# Patient Record
Sex: Female | Born: 1995 | Race: Black or African American | Hispanic: No | Marital: Single | Smoking: Never smoker
Health system: Southern US, Community
[De-identification: ages and names within clinical notes are randomized; demographics above are authoritative.]

## PROBLEM LIST (undated history)

## (undated) DIAGNOSIS — B20 Human immunodeficiency virus [HIV] disease: Secondary | ICD-10-CM

## (undated) DIAGNOSIS — Z21 Asymptomatic human immunodeficiency virus [HIV] infection status: Secondary | ICD-10-CM

## (undated) HISTORY — DX: Human immunodeficiency virus (HIV) disease: B20

## (undated) HISTORY — DX: Asymptomatic human immunodeficiency virus (hiv) infection status: Z21

## (undated) HISTORY — PX: OTHER SURGICAL HISTORY: SHX169

---

## 2021-02-26 ENCOUNTER — Ambulatory Visit: Payer: Self-pay

## 2021-02-26 ENCOUNTER — Telehealth: Payer: Self-pay

## 2021-02-26 VITALS — Ht 62.21 in

## 2021-02-26 DIAGNOSIS — Z21 Asymptomatic human immunodeficiency virus [HIV] infection status: Secondary | ICD-10-CM | POA: Insufficient documentation

## 2021-02-26 NOTE — Telephone Encounter (Signed)
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  Need to: Complete Epi Send for Chest x-ray

## 2021-02-26 NOTE — Telephone Encounter (Signed)
Phone call to pt with [employer] interpreter [phone].  Epi completed. Chest x-ray ordered.  See completed TB document dated 02/26/21.

## 2021-02-26 NOTE — Addendum Note (Signed)
Addended by: Tracey Harries on: 02/26/2021 02:35 PM   Modules accepted: Orders, Level of Service

## 2021-02-26 NOTE — Progress Notes (Signed)
The information found in this document was obtained and reviewed during phone interview with pt.  Pt had negative T-Spot. Epi and chest x-ray needed due to HIV positive and coming from country with high incidence of TB.  Pt states she had not been taking her HIV medication for about 1 month; she was supplied some more about two days ago and restarted it.  Does not know her current wt.  Pt will contact case worker to get transportation to [facility] for chest x-ray. Chest x-ray ordered.

## 2021-03-03 ENCOUNTER — Other Ambulatory Visit: Payer: Self-pay

## 2021-03-03 ENCOUNTER — Ambulatory Visit
Admission: RE | Admit: 2021-03-03 | Discharge: 2021-03-03 | Disposition: A | Discharge status: Home / Self Care | Payer: Self-pay | Attending: Family Medicine | Admitting: Family Medicine

## 2021-03-03 ENCOUNTER — Emergency Department
Admission: EM | Admit: 2021-03-03 | Discharge: 2021-03-03 | Disposition: A | Discharge status: Home / Self Care | Payer: Self-pay | Attending: Emergency Medicine | Admitting: Emergency Medicine

## 2021-03-03 ENCOUNTER — Ambulatory Visit
Admission: RE | Admit: 2021-03-03 | Discharge: 2021-03-03 | Disposition: A | Discharge status: Home / Self Care | Payer: Self-pay | Source: Ambulatory Visit | Attending: Family Medicine | Admitting: Family Medicine

## 2021-03-03 ENCOUNTER — Encounter: Payer: Self-pay | Admitting: Emergency Medicine

## 2021-03-03 DIAGNOSIS — Z21 Asymptomatic human immunodeficiency virus [HIV] infection status: Secondary | ICD-10-CM | POA: Insufficient documentation

## 2021-03-03 DIAGNOSIS — M549 Dorsalgia, unspecified: Secondary | ICD-10-CM | POA: Diagnosis not present

## 2021-03-03 DIAGNOSIS — R310 Gross hematuria: Secondary | ICD-10-CM | POA: Diagnosis not present

## 2021-03-03 DIAGNOSIS — R319 Hematuria, unspecified: Secondary | ICD-10-CM | POA: Diagnosis present

## 2021-03-03 DIAGNOSIS — R519 Headache, unspecified: Secondary | ICD-10-CM | POA: Diagnosis not present

## 2021-03-03 LAB — CBC WITH DIFFERENTIAL/PLATELET
Abs Immature Granulocytes: 0.01 10*3/uL (ref 0.00–0.07)
Basophils Absolute: 0 10*3/uL (ref 0.0–0.1)
Basophils Relative: 0 %
Eosinophils Absolute: 0.1 10*3/uL (ref 0.0–0.5)
Eosinophils Relative: 1 %
HCT: 41.3 % (ref 36.0–46.0)
Hemoglobin: 13.6 g/dL (ref 12.0–15.0)
Immature Granulocytes: 0 %
Lymphocytes Relative: 53 %
Lymphs Abs: 3.1 10*3/uL (ref 0.7–4.0)
MCH: 27.6 pg (ref 26.0–34.0)
MCHC: 32.9 g/dL (ref 30.0–36.0)
MCV: 83.9 fL (ref 80.0–100.0)
Monocytes Absolute: 0.4 10*3/uL (ref 0.1–1.0)
Monocytes Relative: 7 %
Neutro Abs: 2.3 10*3/uL (ref 1.7–7.7)
Neutrophils Relative %: 39 %
Platelets: 226 10*3/uL (ref 150–400)
RBC: 4.92 MIL/uL (ref 3.87–5.11)
RDW: 13.9 % (ref 11.5–15.5)
WBC: 5.8 10*3/uL (ref 4.0–10.5)
nRBC: 0 % (ref 0.0–0.2)

## 2021-03-03 LAB — URINALYSIS, ROUTINE W REFLEX MICROSCOPIC
Bacteria, UA: NONE SEEN
Bilirubin Urine: NEGATIVE
Glucose, UA: NEGATIVE mg/dL
Ketones, ur: NEGATIVE mg/dL
Leukocytes,Ua: NEGATIVE
Nitrite: NEGATIVE
Protein, ur: NEGATIVE mg/dL
Specific Gravity, Urine: 1.011 (ref 1.005–1.030)
pH: 5 (ref 5.0–8.0)

## 2021-03-03 LAB — COMPREHENSIVE METABOLIC PANEL
ALT: 16 U/L (ref 0–44)
AST: 18 U/L (ref 15–41)
Albumin: 4.2 g/dL (ref 3.5–5.0)
Alkaline Phosphatase: 51 U/L (ref 38–126)
Anion gap: 7 (ref 5–15)
BUN: 12 mg/dL (ref 6–20)
CO2: 25 mmol/L (ref 22–32)
Calcium: 9.6 mg/dL (ref 8.9–10.3)
Chloride: 106 mmol/L (ref 98–111)
Creatinine, Ser: 0.59 mg/dL (ref 0.44–1.00)
GFR, Estimated: >60 mL/min (ref >60)
Glucose, Bld: 96 mg/dL (ref 70–99)
Potassium: 5 mmol/L (ref 3.5–5.1)
Sodium: 138 mmol/L (ref 135–145)
Total Bilirubin: 1 mg/dL (ref 0.3–1.2)
Total Protein: 7.7 g/dL (ref 6.5–8.1)

## 2021-03-03 LAB — CK: Total CK: 81 U/L (ref 38–234)

## 2021-03-03 MED ORDER — ACETAMINOPHEN 325 MG PO TABS
650.0000 mg | ORAL_TABLET | Freq: Once | ORAL | Status: AC
  Administered 2021-03-03: 650 mg via ORAL
  Filled 2021-03-03: qty 2

## 2021-03-03 NOTE — ED Provider Notes (Addendum)
[facility] Emergency Department Provider Note   ____________________________________________   Event Date/Time   First MD Initiated Contact with Patient 03/03/21 1815     (approximate)  I have reviewed the triage vital signs and the nursing notes.   HISTORY  Chief Complaint Hematuria    HPI Alicia Gonzalez is a 25 y.o. female history obtained through interpreter.  Patient reports she recently came here from [place].  In [place] she has been having problems with headache and back pain very frequently and takes Tylenol for it.  She was doing well however on her HIV medicine there.  Here she came and did not have any medicine for couple days and then was put on Biktarvy and then has developed hematuria for the several days.  Then she went on her menstrual period and now is bleeding from her menstrual period as well.  She shows me a picture of urine mixed with blood that is in the toilet that happened apparently before her menstrual period.  Patient is not having any fevers or different pain than she usually has.  The only new thing is the medicine and the hematuria.         Past Medical History:  Diagnosis Date   HIV (human immunodeficiency virus infection) [facility])     Patient Active Problem List   Diagnosis Date Noted   HIV positive (HCC) 02/26/2021    Past Surgical History:  Procedure Laterality Date   denies      Prior to Admission medications   Medication Sig Start Date End Date Taking? Authorizing Provider  bictegravir-emtricitabine-tenofovir AF (BIKTARVY) 50-200-25 MG TABS tablet Take 1 tablet by mouth daily. 02/09/21  Yes [provider]    Allergies Patient has no known allergies.  History reviewed. No pertinent family history.  Social History Social History   Tobacco Use   Smoking status: Never   Smokeless tobacco: Never  Vaping Use   Vaping Use: Never used  Substance Use Topics   Alcohol use: Not Currently    Comment:  last drink: about 2 years ago in January   Drug use: Never    Review of Systems  Constitutional: No fever/chills Eyes: No visual changes. ENT: No sore throat. Cardiovascular: Denies chest pain. Respiratory: Denies shortness of breath. Gastrointestinal: No abdominal pain.  No nausea, no vomiting.  No diarrhea.  No constipation. Genitourinary: Negative for dysuria. Musculoskeletal: Negative for new or different back pain. Skin: Negative for rash. Neurological: Negative for new or different headaches, any focal weakness   ____________________________________________   PHYSICAL EXAM:  VITAL SIGNS: ED Triage Vitals  Enc Vitals Group     BP 03/03/21 1708 133/88     Pulse Rate 03/03/21 1708 60     Resp 03/03/21 1708 18     Temp 03/03/21 1708 98.5 F (36.9 C)     Temp Source 03/03/21 1708 Oral     SpO2 03/03/21 1708 100 %     Weight 03/03/21 1709 110 lb 3.7 oz (50 kg)     Height 03/03/21 1709 5\' 2"  (1.575 m)     Head Circumference --      Peak Flow --      Pain Score 03/03/21 1716 0     Pain Loc --      Pain Edu? --      Excl. in GC? --     Constitutional: Alert and oriented. Well appearing and in no acute distress. Eyes: Conjunctivae are normal. Head: Atraumatic. Nose: No  congestion/rhinnorhea. Mouth/Throat: Mucous membranes are moist.   Neck: No stridor.  Cardiovascular: Normal rate, regular rhythm. Grossly normal heart sounds.  Good peripheral circulation. Respiratory: Normal respiratory effort.  No retractions. Lungs CTAB. Gastrointestinal: Soft and nontender. No distention. No abdominal bruits. No CVA tenderness. Musculoskeletal: No lower extremity tenderness nor edema.   Neurologic:  Normal speech and language. No gross focal neurologic deficits are appreciated.  Skin:  Skin is warm, dry and intact. No rash noted.   ____________________________________________   LABS (all labs ordered are listed, but only abnormal results are displayed)  Labs Reviewed   URINALYSIS, ROUTINE W REFLEX MICROSCOPIC - Abnormal; Notable for the following components:      Result Value   Color, Urine YELLOW (*)    APPearance CLEAR (*)    Hgb urine dipstick LARGE (*)    All other components within normal limits  CBC WITH DIFFERENTIAL/PLATELET  COMPREHENSIVE METABOLIC PANEL  CK  POC URINE PREG, ED   ____________________________________________  EKG   ____________________________________________  RADIOLOGY Gertha Calkin, personally viewed and evaluated these images (plain radiographs) as part of my medical decision making, as well as reviewing the written report by the radiologist.  ED MD interpretation: Chest x-ray done and read by radiology reviewed by me shows no acute disease this was apparently done for screening for TB.  Official radiology report(s): DG Chest 1 View  Result Date: 03/03/2021 CLINICAL DATA:  HIV positive. EXAM: CHEST  1 VIEW COMPARISON:  None. FINDINGS: The heart size and mediastinal contours are within normal limits. Both lungs are clear. The visualized skeletal structures are unremarkable. IMPRESSION: No active disease. Electronically Signed   By: Titus Dubin M.D.   On: 03/03/2021 15:57    ____________________________________________   PROCEDURES  Procedure(s) performed (including Critical Care):  Procedures   ____________________________________________   INITIAL IMPRESSION / ASSESSMENT AND PLAN / ED COURSE  Patient shows me a picture of the urine with blood in it in the toilet.  From what I understand this is from before her menstrual period.  Currently there is no blood in her urine.  Her renal function is normal and CBC is normal.  I will have her follow-up with primary care.  She will return if worse.  I discussed her case with [facility].  My questions were relayed to the ID doctor on-call who felt there was no cause for alarm at her history of hematuria even with the medication for HIV that she is on which can cause  renal problems.  I will have her return if she is worse and follow-up with primary care for further evaluation of her apparent hematuria.  Currently she is on her menses.  Even being on her menses there was no blood in the UA.  Again there were no red cells at all and the UA.          ____________________________________________   FINAL CLINICAL IMPRESSION(S) / ED DIAGNOSES  Final diagnoses:  Gross hematuria   Actual diagnosis is history of gross hematuria  ED Discharge Orders     None        Note:  This document was prepared using Dragon voice recognition software and may include unintentional dictation errors.    Nena Polio, MD 03/03/21 2047    Nena Polio, MD 03/03/21 2116

## 2021-03-03 NOTE — ED Triage Notes (Signed)
Pt comes into the ED via POV c/o hematuria.  Pt states that this started after starting a new medicine.  Pt explains that she was sent for a scan, given meds, and after that the blood in her urine started.  RN clarified between vaginal bleeding vs urine.  Pt states that she isnt sure, but that she is only seeing the blood in her urine currently.  Pt states that the scan she had was of her chest to r/o any TB, pt states that she has not received these results back yet.

## 2021-03-03 NOTE — Discharge Instructions (Signed)
I discussed your case with infectious disease at [facility].  They feel that she should continue taking your medicine and follow-up with them on your previously scheduled appointment on the 15th of this month.  Please return here for any abdominal pain, fever or vomiting or if you feel sicker.  Also return if you have back pain is unlike your usual back pain.  Please follow-up with your primary care doctor as well.  Currently there is no blood in your urine and your kidney function is normal.

## 2021-04-19 ENCOUNTER — Emergency Department
Admission: EM | Admit: 2021-04-19 | Discharge: 2021-04-19 | Disposition: A | Discharge status: Home / Self Care | Payer: Self-pay | Attending: Emergency Medicine | Admitting: Emergency Medicine

## 2021-04-19 ENCOUNTER — Other Ambulatory Visit: Payer: Self-pay

## 2021-04-19 ENCOUNTER — Encounter: Payer: Self-pay | Admitting: Radiology

## 2021-04-19 ENCOUNTER — Emergency Department: Payer: Self-pay

## 2021-04-19 DIAGNOSIS — R109 Unspecified abdominal pain: Secondary | ICD-10-CM | POA: Diagnosis not present

## 2021-04-19 DIAGNOSIS — E860 Dehydration: Secondary | ICD-10-CM | POA: Diagnosis not present

## 2021-04-19 DIAGNOSIS — R112 Nausea with vomiting, unspecified: Secondary | ICD-10-CM | POA: Insufficient documentation

## 2021-04-19 DIAGNOSIS — Z20822 Contact with and (suspected) exposure to covid-19: Secondary | ICD-10-CM | POA: Diagnosis not present

## 2021-04-19 DIAGNOSIS — Z21 Asymptomatic human immunodeficiency virus [HIV] infection status: Secondary | ICD-10-CM | POA: Diagnosis not present

## 2021-04-19 DIAGNOSIS — Z79899 Other long term (current) drug therapy: Secondary | ICD-10-CM | POA: Diagnosis not present

## 2021-04-19 LAB — CBC
HCT: 42.5 % (ref 36.0–46.0)
Hemoglobin: 14.3 g/dL (ref 12.0–15.0)
MCH: 27.7 pg (ref 26.0–34.0)
MCHC: 33.6 g/dL (ref 30.0–36.0)
MCV: 82.2 fL (ref 80.0–100.0)
Platelets: 300 10*3/uL (ref 150–400)
RBC: 5.17 MIL/uL — ABNORMAL HIGH (ref 3.87–5.11)
RDW: 12.5 % (ref 11.5–15.5)
WBC: 9.6 10*3/uL (ref 4.0–10.5)
nRBC: 0 % (ref 0.0–0.2)

## 2021-04-19 LAB — COMPREHENSIVE METABOLIC PANEL
ALT: 27 U/L (ref 0–44)
AST: 53 U/L — ABNORMAL HIGH (ref 15–41)
Albumin: 4.3 g/dL (ref 3.5–5.0)
Alkaline Phosphatase: 70 U/L (ref 38–126)
Anion gap: 19 — ABNORMAL HIGH (ref 5–15)
BUN: 17 mg/dL (ref 6–20)
CO2: 12 mmol/L — ABNORMAL LOW (ref 22–32)
Calcium: 8.9 mg/dL (ref 8.9–10.3)
Chloride: 106 mmol/L (ref 98–111)
Creatinine, Ser: 1 mg/dL (ref 0.44–1.00)
GFR, Estimated: >60 mL/min (ref >60)
Glucose, Bld: 77 mg/dL (ref 70–99)
Potassium: 4.1 mmol/L (ref 3.5–5.1)
Sodium: 137 mmol/L (ref 135–145)
Total Bilirubin: 1.9 mg/dL — ABNORMAL HIGH (ref 0.3–1.2)
Total Protein: 7.9 g/dL (ref 6.5–8.1)

## 2021-04-19 LAB — LIPASE, BLOOD: Lipase: 20 U/L (ref 11–51)

## 2021-04-19 LAB — URINALYSIS, COMPLETE (UACMP) WITH MICROSCOPIC
Bilirubin Urine: NEGATIVE
Glucose, UA: NEGATIVE mg/dL
Ketones, ur: 80 mg/dL — AB
Leukocytes,Ua: NEGATIVE
Nitrite: NEGATIVE
Protein, ur: 30 mg/dL — AB
Specific Gravity, Urine: 1.024 (ref 1.005–1.030)
WBC, UA: NONE SEEN WBC/hpf (ref 0–5)
pH: 5 (ref 5.0–8.0)

## 2021-04-19 LAB — TROPONIN I (HIGH SENSITIVITY): Troponin I (High Sensitivity): 3 ng/L (ref <18)

## 2021-04-19 LAB — POC URINE PREG, ED: Preg Test, Ur: NEGATIVE

## 2021-04-19 LAB — RESP PANEL BY RT-PCR (FLU A&B, COVID) ARPGX2
Influenza A by PCR: NEGATIVE
Influenza B by PCR: NEGATIVE
SARS Coronavirus 2 by RT PCR: NEGATIVE

## 2021-04-19 MED ORDER — DICYCLOMINE HCL 10 MG PO CAPS: 10.0000 mg | ORAL_CAPSULE | Freq: Two times a day (BID) | ORAL | 0 refills | Status: AC | PRN

## 2021-04-19 MED ORDER — SODIUM CHLORIDE 0.9 % IV BOLUS
1000.0000 mL | Freq: Once | INTRAVENOUS | Status: AC
  Administered 2021-04-19: 13:00:00 1000 mL via INTRAVENOUS

## 2021-04-19 MED ORDER — ONDANSETRON HCL 4 MG/2ML IJ SOLN
4.0000 mg | Freq: Once | INTRAMUSCULAR | Status: AC
  Administered 2021-04-19: 12:00:00 4 mg via INTRAVENOUS
  Filled 2021-04-19: qty 2

## 2021-04-19 MED ORDER — MORPHINE SULFATE (PF) 4 MG/ML IV SOLN
4.0000 mg | Freq: Once | INTRAVENOUS | Status: AC
  Administered 2021-04-19: 13:00:00 4 mg via INTRAVENOUS
  Filled 2021-04-19: qty 1

## 2021-04-19 MED ORDER — KETOROLAC TROMETHAMINE 30 MG/ML IJ SOLN
15.0000 mg | Freq: Once | INTRAMUSCULAR | Status: AC
  Administered 2021-04-19: 12:00:00 15 mg via INTRAVENOUS
  Filled 2021-04-19: qty 1

## 2021-04-19 MED ORDER — SODIUM CHLORIDE 0.9 % IV BOLUS
1000.0000 mL | Freq: Once | INTRAVENOUS | Status: AC
  Administered 2021-04-19: 12:00:00 1000 mL via INTRAVENOUS

## 2021-04-19 MED ORDER — IOHEXOL 300 MG/ML  SOLN
100.0000 mL | Freq: Once | INTRAMUSCULAR | Status: AC | PRN
  Administered 2021-04-19: 13:00:00 80 mL via INTRAVENOUS
  Filled 2021-04-19: qty 100

## 2021-04-19 MED ORDER — ONDANSETRON HCL 4 MG PO TABS: 4.0000 mg | ORAL_TABLET | Freq: Every day | ORAL | 0 refills | Status: AC | PRN

## 2021-04-19 NOTE — ED Triage Notes (Signed)
Pt brought in by [facility] with co vomiting since yesterday. States co generalized body numbness and headache. Pt noted to be hyperventilating in triage. Pt also co palpitations.

## 2021-04-19 NOTE — ED Notes (Signed)
Assessed pain with interpreter stick.  Pt requesting food and water. Explained need CT results first. Pt would like water with no ice and snacks once ok per doctor. Pt requests warm blankets and provided warm blankets at this time.

## 2021-04-19 NOTE — ED Provider Notes (Signed)
[facility] Emergency Department Provider Note  Time seen: 11:29 AM  I have reviewed the triage vital signs and the nursing notes.   HISTORY  Chief Complaint Abdominal Pain   HPI Alicia Gonzalez is a 25 y.o. female with a past medical history of HIV who presents to the emergency department for nausea and vomiting.  Patient is from [place] moved to the [place] 3 months ago who presents to the emergency department for nausea and vomiting.  Patient states over the past 2 days she has been nauseated with frequent episodes of vomiting.  Feels dehydrated has not been able to tolerate fluids.  Denies any known fever.  No cough or congestion.  No diarrhea.  Patient denies any significant abdominal pain states some mild abdominal cramping across the upper abdomen but only when she vomits.  Last menstrual period was 1 week ago.  Patient is HIV positive but is already followed up with the PCP in the [place] and is on antiviral medications.   Past Medical History:  Diagnosis Date   HIV (human immunodeficiency virus infection) [facility])     Patient Active Problem List   Diagnosis Date Noted   HIV positive (HCC) 02/26/2021    Past Surgical History:  Procedure Laterality Date   denies      Prior to Admission medications   Medication Sig Start Date End Date Taking? Authorizing Provider  bictegravir-emtricitabine-tenofovir AF (BIKTARVY) 50-200-25 MG TABS tablet Take 1 tablet by mouth daily. 02/09/21   [provider]    No Known Allergies  No family history on file.  Social History Social History   Tobacco Use   Smoking status: Never   Smokeless tobacco: Never  Vaping Use   Vaping Use: Never used  Substance Use Topics   Alcohol use: Not Currently    Comment: last drink: about 2 years ago in January   Drug use: Never    Review of Systems Constitutional: Negative for fever. Cardiovascular: Negative for chest pain. Respiratory: Negative for shortness of  breath. Gastrointestinal: Intermittent abdominal cramping.  Positive for nausea vomiting.  Negative for diarrhea. Genitourinary: Negative for urinary compaints.  Last menstrual period 1 week ago. Musculoskeletal: Negative for musculoskeletal complaints Neurological: Negative for headache All other ROS negative  ____________________________________________   PHYSICAL EXAM:  VITAL SIGNS: ED Triage Vitals  Enc Vitals Group     BP 04/19/21 1027 (!) 130/91     Pulse Rate 04/19/21 1027 97     Resp 04/19/21 1027 (!) 26     Temp 04/19/21 1027 98.4 F (36.9 C)     Temp Source 04/19/21 1027 Oral     SpO2 04/19/21 1027 99 %     Weight 04/19/21 1031 110 lb (49.9 kg)     Height --      Head Circumference --      Peak Flow --      Pain Score 04/19/21 1031 0     Pain Loc --      Pain Edu? --      Excl. in GC? --    Constitutional: Alert and oriented.  Lying in bed, no acute distress. Eyes: Normal exam ENT      Head: Normocephalic and atraumatic.      Mouth/Throat: Somewhat dry appearing mucous membranes. Cardiovascular: Normal rate, regular rhythm.  Respiratory: Normal respiratory effort without tachypnea nor retractions. Breath sounds are clear Gastrointestinal: Soft and nontender. No distention.  No reaction to abdominal palpation. Musculoskeletal: Nontender with normal range of  motion in all extremities. Neurologic:  Normal speech and language. No gross focal neurologic deficits  Skin:  Skin is warm, dry and intact.  Psychiatric: Mood and affect are normal.   ____________________________________________    EKG  EKG viewed and interpreted by myself shows a normal sinus rhythm at 87 bpm with a narrow QRS, normal axis, normal intervals, nonspecific but no concerning ST changes.  ____________________________________________    RADIOLOGY  Chest x-ray negative CT scan negative.  ____________________________________________   INITIAL IMPRESSION / ASSESSMENT AND PLAN / ED  COURSE  Pertinent labs & imaging results that were available during my care of the patient were reviewed by me and considered in my medical decision making (see chart for details).   Patient presents emergency department for nausea and vomiting.  Overall patient appears well, benign abdominal exam.  Patient's lab work is overall reassuring as well besides an anion gap of 19 consistent with dehydration.  We will start the patient on IV fluids, dose 2 L of normal saline, Zofran and Toradol.  We will check for COVID/flu as well as a urinalysis and urine pregnancy test.  Patient agreeable to plan.  Patient is from [place] moved to the [place] 3 months ago.  No fever.  Is HIV positive on antivirals.  Patient's labs are largely within normal limits besides ketones in the urine and anion gap on her chemistry consistent with dehydration.  Patient is on her second liter of fluid.  States she is feeling much better.  CT scan of the abdomen and pelvis is negative.  Urinalysis shows ketones but otherwise no infection.  Pregnancy test is negative.  COVID/flu is negative.  As the patient is feeling much better she denies any pain at this time no longer feels nauseated has been able to drink water.  We will discharge patient home with Bentyl and Zofran to be used as needed I encouraged plenty of hydration and rest and discussed return precautions.  Patient agreeable to plan.  Alicia Gonzalez was evaluated in Emergency Department on 04/19/2021 for the symptoms described in the history of present illness. She was evaluated in the context of the global COVID-19 pandemic, which necessitated consideration that the patient might be at risk for infection with the SARS-CoV-2 virus that causes COVID-19. Institutional protocols and algorithms that pertain to the evaluation of patients at risk for COVID-19 are in a state of rapid change based on information released by regulatory bodies including the CDC and federal and state  organizations. These policies and algorithms were followed during the patient's care in the ED.  ____________________________________________   FINAL CLINICAL IMPRESSION(S) / ED DIAGNOSES  Nausea vomiting Dehydration   Minna Antis, MD 04/19/21 1455

## 2021-04-19 NOTE — ED Notes (Signed)
ED provider at bedside with remote interpreter.

## 2021-04-19 NOTE — ED Notes (Signed)
Pt to ED for abdominal pain, N/V since yesterday. Cannot eat anything. States emesis is painful. Pain "comes in my chest when I feel like I'm going to throw up and then my whole body feels paralyzed" and "I'm feeling weak". Speaking to pt through interpreter.

## 2021-04-19 NOTE — ED Notes (Signed)
Pt in CT scans.

## 2021-04-19 NOTE — ED Notes (Signed)
Pt still appears to be insignificant pain. Face diaphoretic. Informed EDP. Pregnancy was negative.

## 2021-04-19 NOTE — ED Notes (Signed)
Pt speaks Kinya [place], portable interpreter services used.

## 2021-04-19 NOTE — ED Notes (Signed)
Per EMS  presents with some n/v and back pain

## 2021-06-24 ENCOUNTER — Other Ambulatory Visit: Payer: Self-pay

## 2021-06-24 ENCOUNTER — Encounter: Payer: Self-pay | Admitting: Emergency Medicine

## 2021-06-24 ENCOUNTER — Emergency Department
Admission: EM | Admit: 2021-06-24 | Discharge: 2021-06-24 | Disposition: A | Discharge status: Home / Self Care | Payer: Self-pay | Attending: Emergency Medicine | Admitting: Emergency Medicine

## 2021-06-24 DIAGNOSIS — M62830 Muscle spasm of back: Secondary | ICD-10-CM | POA: Diagnosis not present

## 2021-06-24 DIAGNOSIS — M546 Pain in thoracic spine: Secondary | ICD-10-CM | POA: Diagnosis present

## 2021-06-24 LAB — URINALYSIS, ROUTINE W REFLEX MICROSCOPIC
Bilirubin Urine: NEGATIVE
Glucose, UA: NEGATIVE mg/dL
Hgb urine dipstick: NEGATIVE
Ketones, ur: NEGATIVE mg/dL
Nitrite: NEGATIVE
Protein, ur: NEGATIVE mg/dL
Specific Gravity, Urine: 1.018 (ref 1.005–1.030)
pH: 7 (ref 5.0–8.0)

## 2021-06-24 LAB — PREGNANCY, URINE: Preg Test, Ur: NEGATIVE

## 2021-06-24 MED ORDER — LIDOCAINE 5 % EX PTCH
1.0000 | MEDICATED_PATCH | Freq: Once | CUTANEOUS | Status: DC
  Administered 2021-06-24: 1 via TRANSDERMAL
  Filled 2021-06-24: qty 1

## 2021-06-24 MED ORDER — LIDOCAINE 5 % EX PTCH: 1.0000 | MEDICATED_PATCH | Freq: Two times a day (BID) | CUTANEOUS | 0 refills | Status: AC | PRN

## 2021-06-24 MED ORDER — KETOROLAC TROMETHAMINE 30 MG/ML IJ SOLN
30.0000 mg | Freq: Once | INTRAMUSCULAR | Status: AC
Start: 2021-06-24 — End: 2021-06-24
  Administered 2021-06-24: 30 mg via INTRAMUSCULAR
  Filled 2021-06-24: qty 1

## 2021-06-24 MED ORDER — CYCLOBENZAPRINE HCL 5 MG PO TABS: 5.0000 mg | ORAL_TABLET | Freq: Three times a day (TID) | ORAL | 0 refills | Status: AC | PRN

## 2021-06-24 NOTE — ED Provider Notes (Signed)
[facility] Emergency Department Provider Note     Event Date/Time   First MD Initiated Contact with Patient 06/24/21 1145     (approximate)   History   Back Pain   HPI  History is limited by Kinyarwanda. Audio interpreter used for interview and exam.  Alicia Gonzalez is a 26 y.o. female presents to the ED for evaluation of right thoracic back pain. Pain is aggravated by movement or the RUE and deep breaths. Pain is localized to the posterior scapulothoracic musculature. She denies any recent injury, trauma, falls, or blunt chest pain. She notes occasional muscle spasms.     Physical Exam   Triage Vital Signs: ED Triage Vitals  Enc Vitals Group     BP 06/24/21 1120 (!) 122/91     Pulse Rate 06/24/21 1120 75     Resp 06/24/21 1120 16     Temp 06/24/21 1120 98.4 F (36.9 C)     Temp Source 06/24/21 1120 Oral     SpO2 06/24/21 1120 96 %     Weight 06/24/21 1121 110 lb 0.2 oz (49.9 kg)     Height 06/24/21 1121 5\' 2"  (1.575 m)     Head Circumference --      Peak Flow --      Pain Score --      Pain Loc --      Pain Edu? --      Excl. in GC? --     Most recent vital signs: Vitals:   06/24/21 1120 06/24/21 1321  BP: (!) 122/91 126/78  Pulse: 75 68  Resp: 16 18  Temp: 98.4 F (36.9 C)   SpO2: 96% 100%    General Awake, no distress.  CV:  Good peripheral perfusion.  RESP:  Normal effort.  ABD:  No distention.  MSK:   No chest wall deformity, ecchymosis, or edema. Tender to palp over the left scapulothoracic musculature. Normal LUE ROM. No evidence of  internal derangement to the shoulder. Normal composite fists bilaterally.      ED Results / Procedures / Treatments   Labs (all labs ordered are listed, but only abnormal results are displayed) Labs Reviewed  URINALYSIS, ROUTINE W REFLEX MICROSCOPIC - Abnormal; Notable for the following components:      Result Value   Color, Urine YELLOW (*)    APPearance HAZY (*)    Leukocytes,Ua  MODERATE (*)    Bacteria, UA RARE (*)    All other components within normal limits  PREGNANCY, URINE     EKG   RADIOLOGY  No results found.   PROCEDURES:  Critical Care performed: No  Procedures   MEDICATIONS ORDERED IN ED: Medications  lidocaine (LIDODERM) 5 % 1 patch (1 patch Transdermal Patch Applied 06/24/21 1249)  ketorolac (TORADOL) 30 MG/ML injection 30 mg (30 mg Intramuscular Given 06/24/21 1248)     IMPRESSION / MDM / ASSESSMENT AND PLAN / ED COURSE  I reviewed the triage vital signs and the nursing notes.                              Differential diagnosis includes, but is not limited to, chest contusion, shoulder dislocation, rib fracture, CAP, muscle strain  Patient with ED evaluation of intermittent left scapulothoracic back pain. Her exam is consistent with muscle strain and spasms. She is experiencing reproducible MSK pain that is tender to palp and worsened with RUE movement. VSS  and UA and urine pregnancy tests are normal. Patient will be discharged home with prescriptions for Lidoderm patches and cyclobenzaprine. Patient is to follow up with her PCP or local community clinic as needed or otherwise directed. Patient is given ED precautions to return to the ED for any worsening or new symptoms.    FINAL CLINICAL IMPRESSION(S) / ED DIAGNOSES   Final diagnoses:  Muscle spasm of back     Rx / DC Orders   ED Discharge Orders          Ordered    cyclobenzaprine (FLEXERIL) 5 MG tablet  3 times daily PRN        06/24/21 1309    lidocaine (LIDODERM) 5 %  Every 12 hours PRN        06/24/21 1309             Note:  This document was prepared using Dragon voice recognition software and may include unintentional dictation errors.    Lissa Hoard, PA-C 06/24/21 1415    Merwyn Katos, MD 06/25/21 [phone]

## 2021-06-24 NOTE — ED Notes (Signed)
Pt has back pain. Unable to speak english, pt needs interpreter.

## 2021-06-24 NOTE — ED Triage Notes (Signed)
Pt here with right side flank pain and itching all over. Pt denies any new detergents or lotions. Pt states she has pain when she urinates. Pt in NAD in triage.

## 2021-06-24 NOTE — Discharge Instructions (Addendum)
You exam is consistent with muscle spasm. Take the prescription medicines as directed. You may follow-up with your Infectious Disease provider for further evaluation of generalized itching. Consider taking OTC Pepcid  and Benadryl for itch relief.   Wowe ikizamini gihuye n'imitsi. Fata imiti yandikiwe nkuko byateganijwe. Alicia Gonzalez gukurikirana hamwe nu mutanga wawe wanduye kugirango arusheho gusuzuma isuzuma rusange. Tekereza gufata OTC Pepcid na Benadryl kugirango uborohereze.

## 2023-01-05 IMAGING — CR DG CHEST 1V
1 series · 1 of 1 positions shown · non-contrast
Comparison: None.

CLINICAL DATA: HIV positive.

EXAM:
CHEST  1 VIEW

[dg chest 1 view]
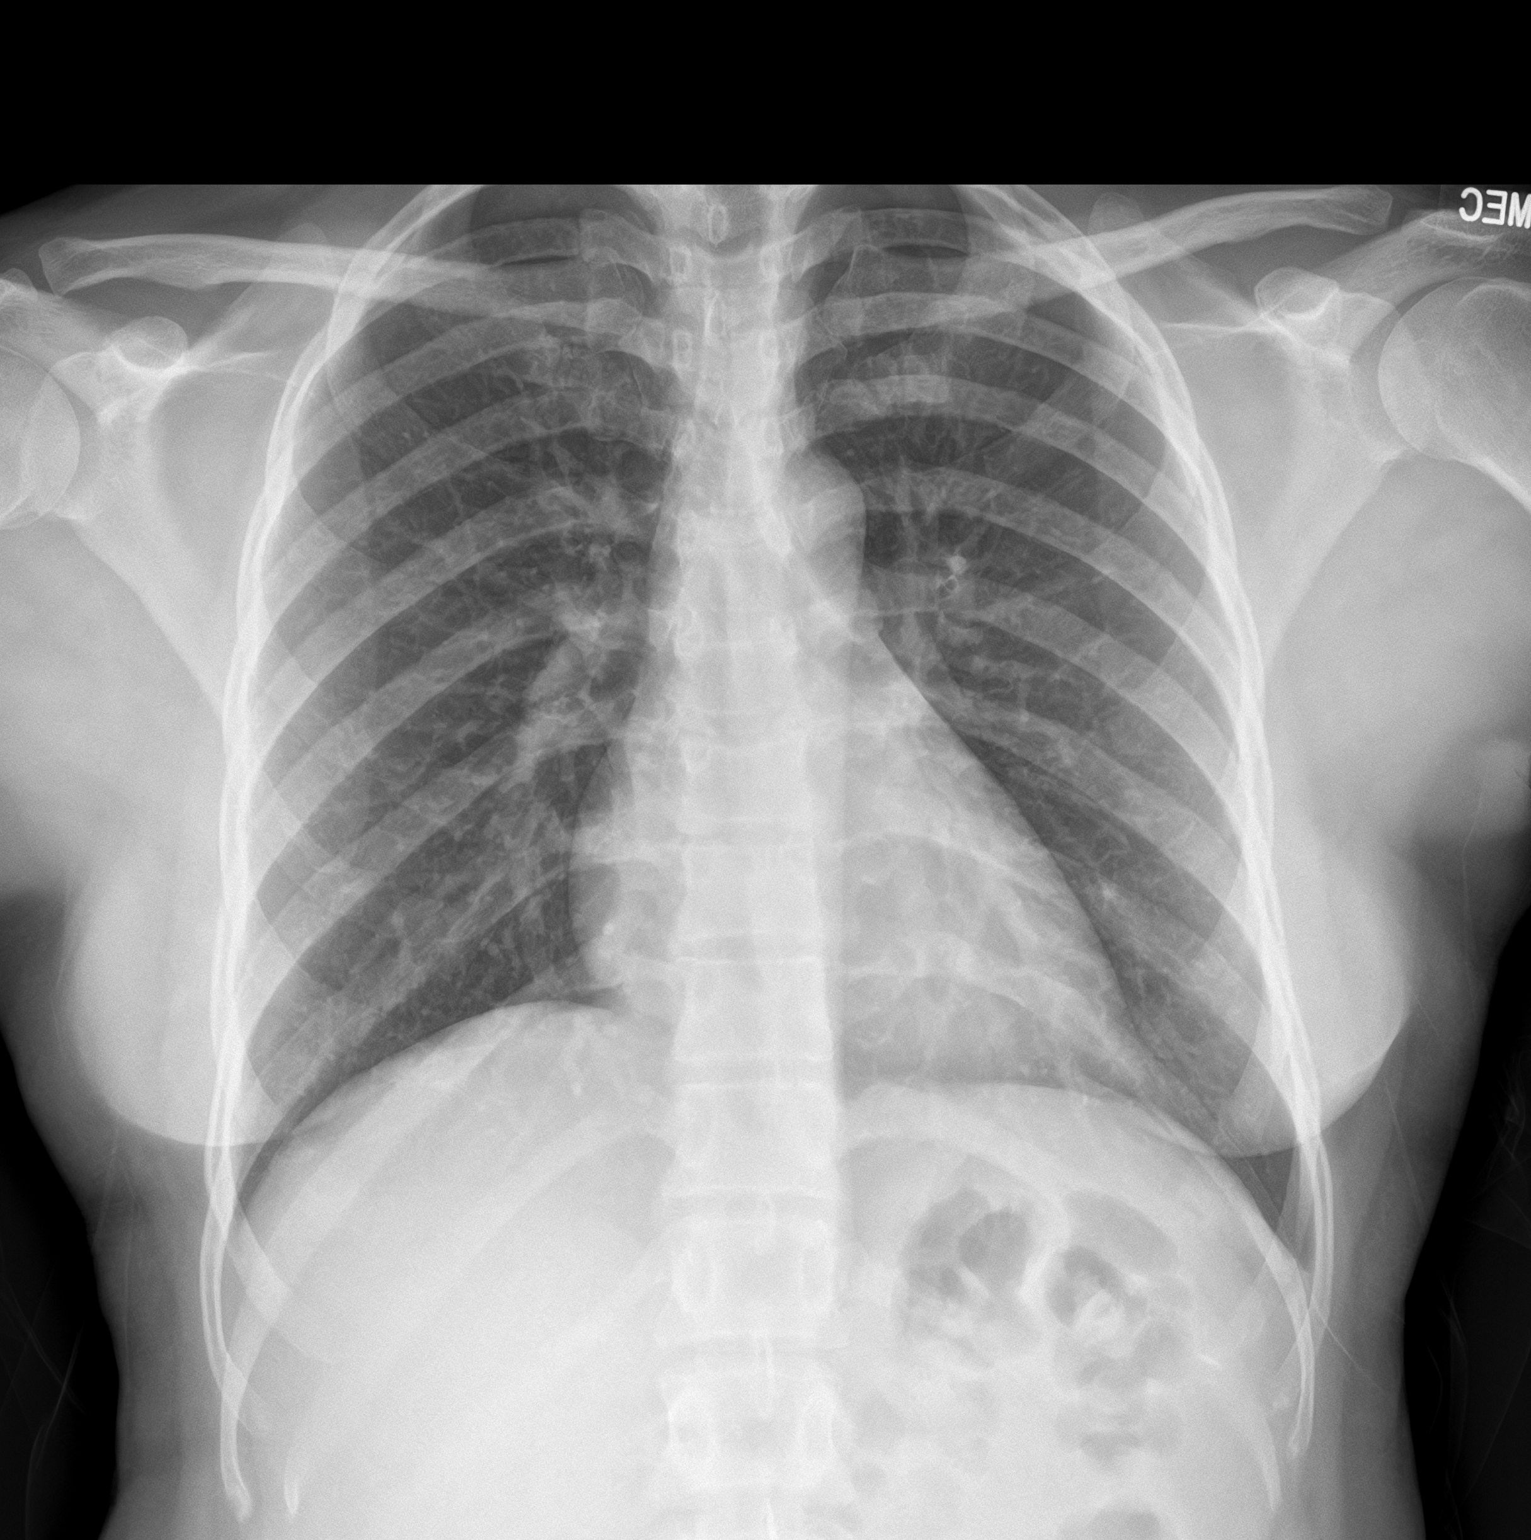

[1 of 1 positions shown; findings below may reference images not displayed]

FINDINGS: The heart size and mediastinal contours are within normal limits.
Both lungs are clear. The visualized skeletal structures are
unremarkable.
IMPRESSION: No active disease.

## 2023-02-21 IMAGING — CR DG CHEST 2V
1 series · 2 of 2 positions shown · non-contrast
Comparison: None.

CLINICAL DATA: Chest pain

EXAM:
CHEST - 2 VIEW

[Series 1: dg chest 2 view · 0.14mm/px · 2 of 2 slices shown]
[im 1/2]
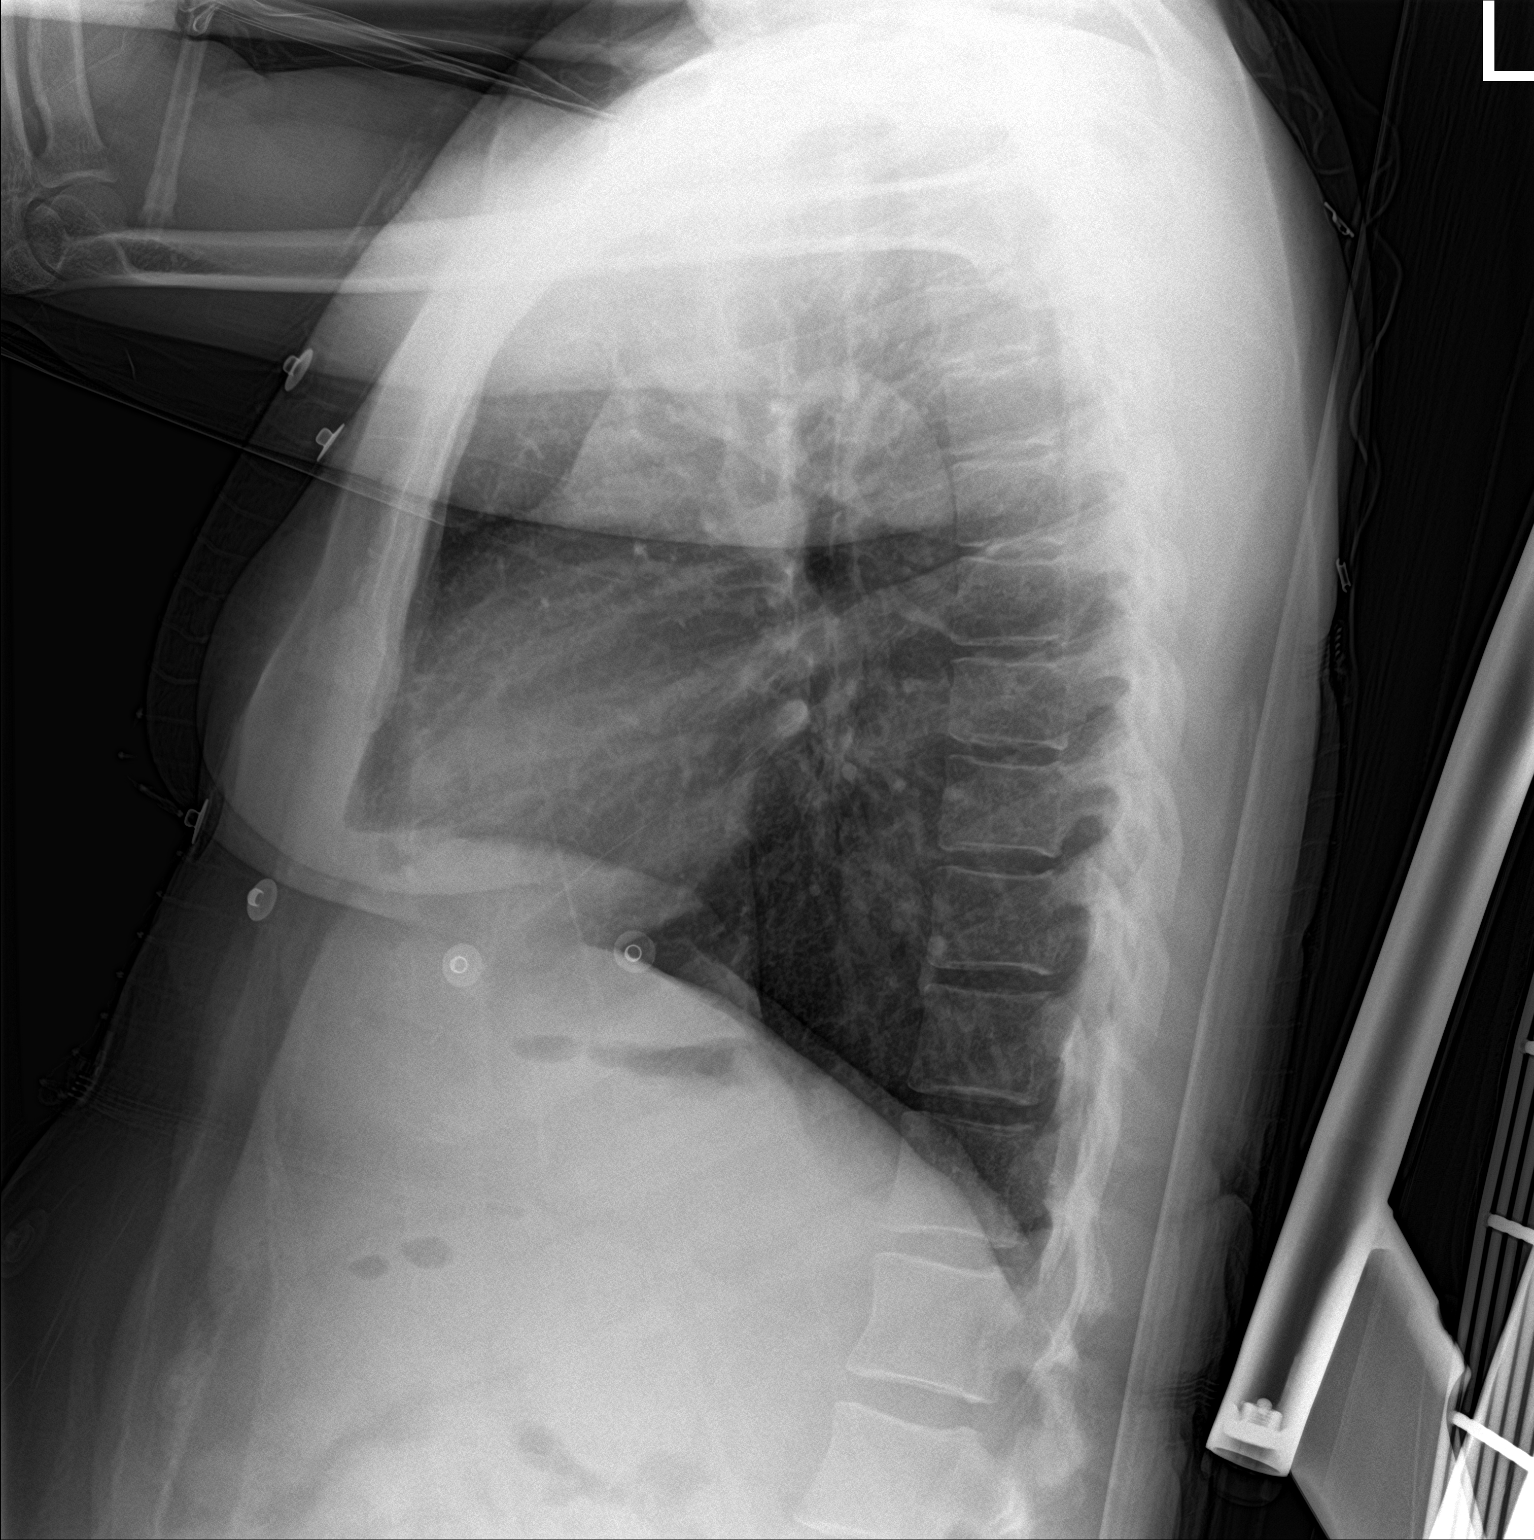
[im 2/2]
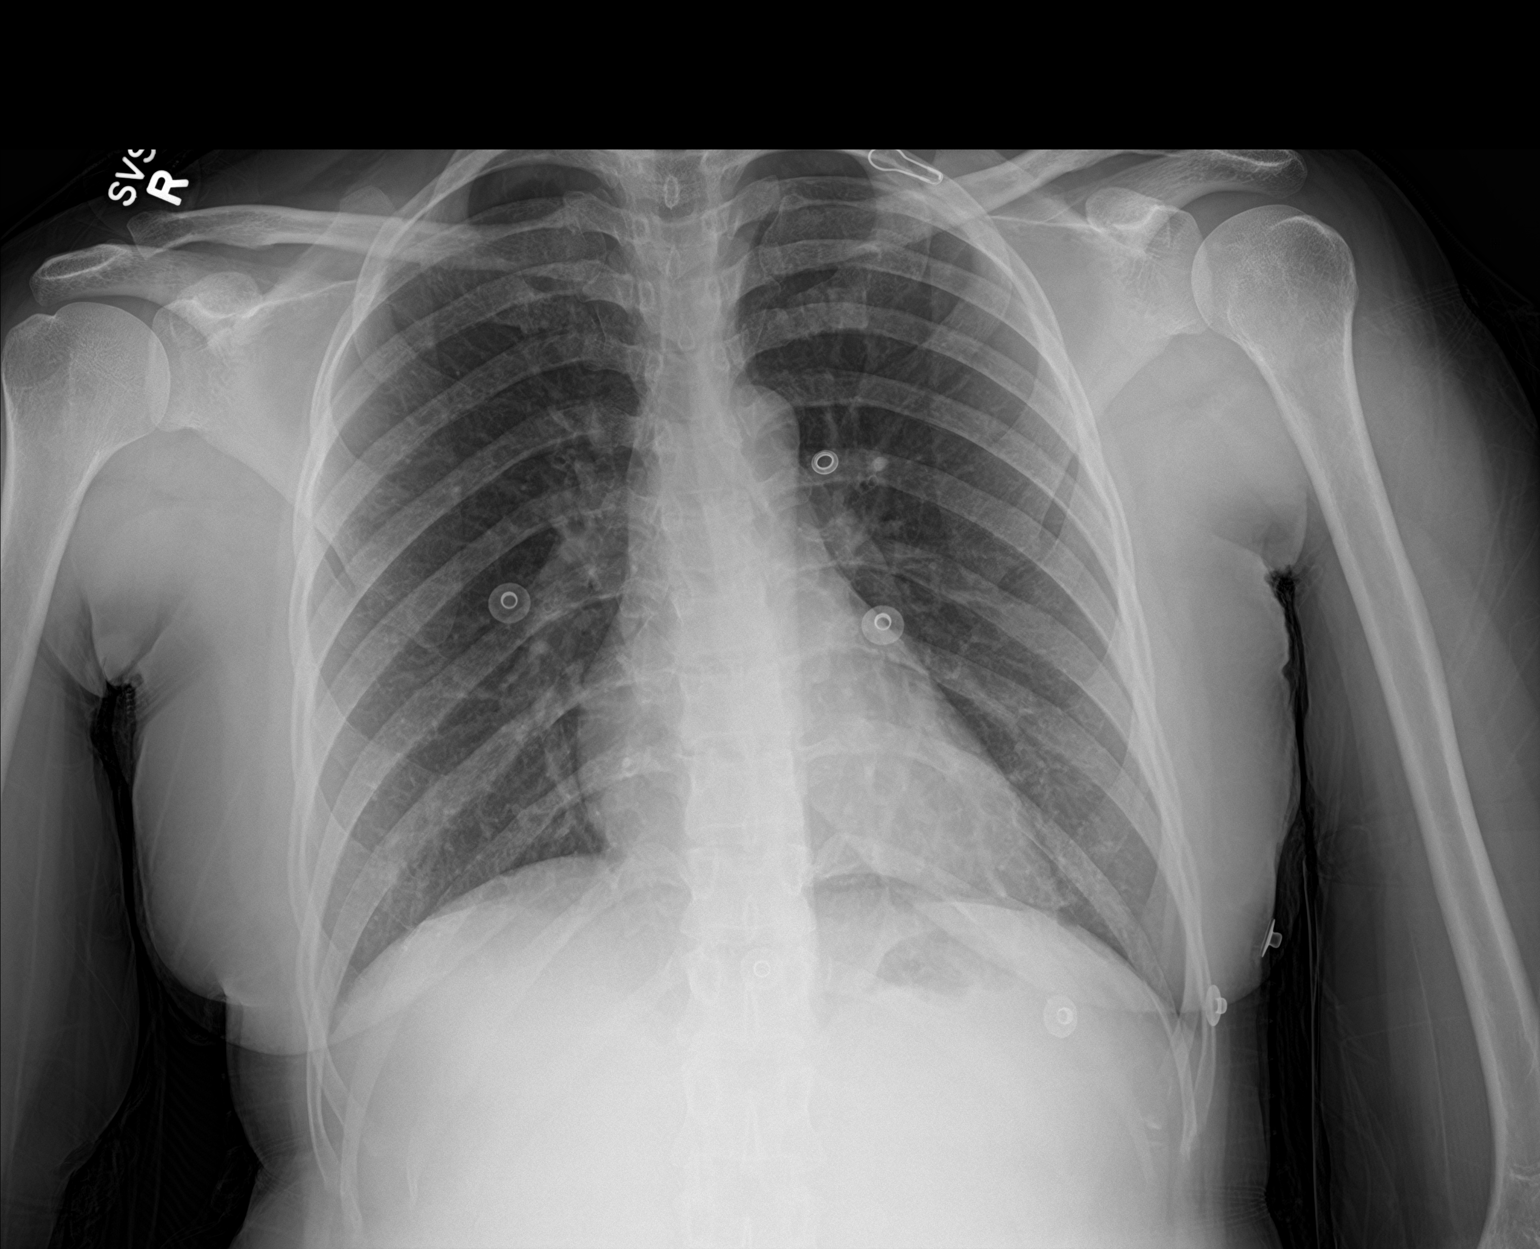

[2 of 2 positions shown; findings below may reference images not displayed]

FINDINGS: The heart size and mediastinal contours are within normal limits.
Both lungs are clear. The visualized skeletal structures are
unremarkable.
IMPRESSION: No active cardiopulmonary disease.

## 2023-02-21 IMAGING — CT CT ABD-PELV W/ CM
2 of 4 series · 16 of 46 positions shown, 18 images · IV contrast (APPLIED)
Comparison: None.

CLINICAL DATA: Abdominal pain, vomiting

EXAM:
CT ABDOMEN AND PELVIS WITH CONTRAST
TECHNIQUE: Multidetector CT imaging of the abdomen and pelvis was performed
using the standard protocol following bolus administration of
intravenous contrast.
CONTRAST:  80mL OMNIPAQUE IOHEXOL 300 MG/ML  SOLN

[Series 2: routine abd/pel with · axial · 0.74mm/px · z∈[-411,+14]mm · 13 of 93 slices shown, 15 images]
[im 4/93  soft-tissue]
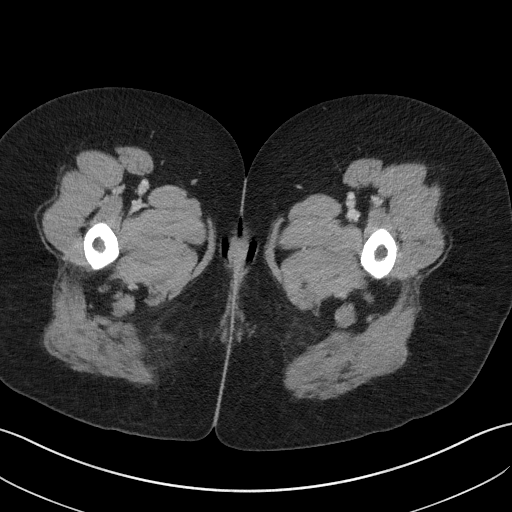
[im 4/93  bone]
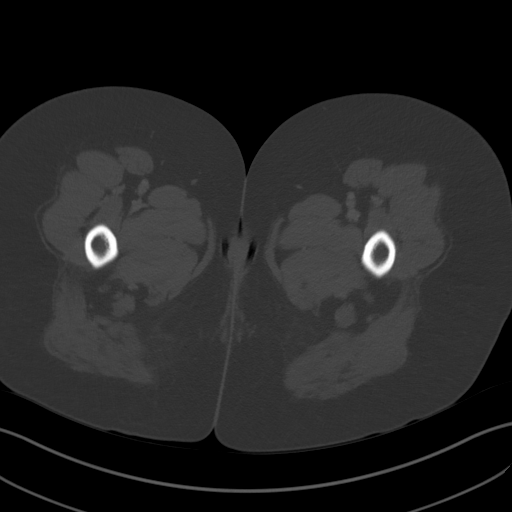
[im 12/93  soft-tissue]
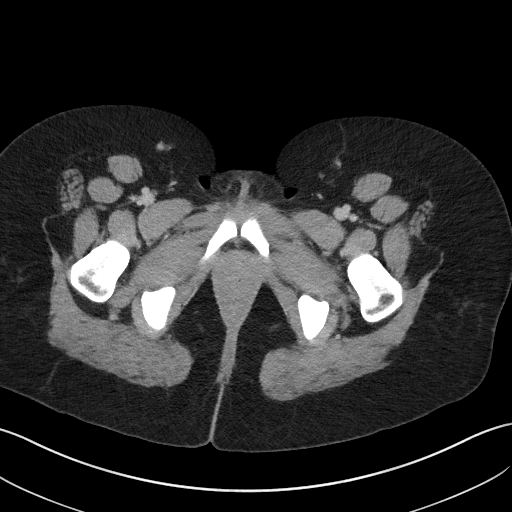
[im 20/93  soft-tissue]
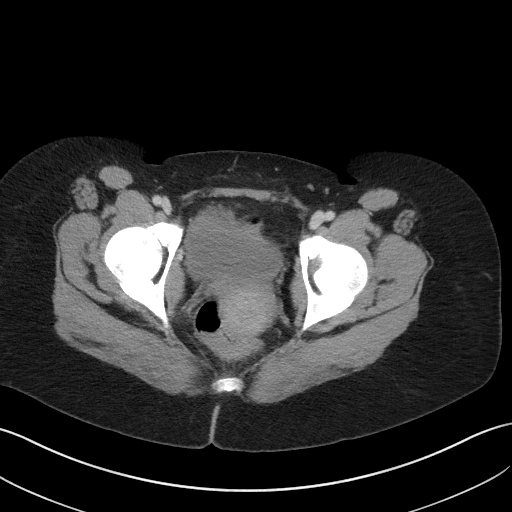
[im 27/93  soft-tissue]
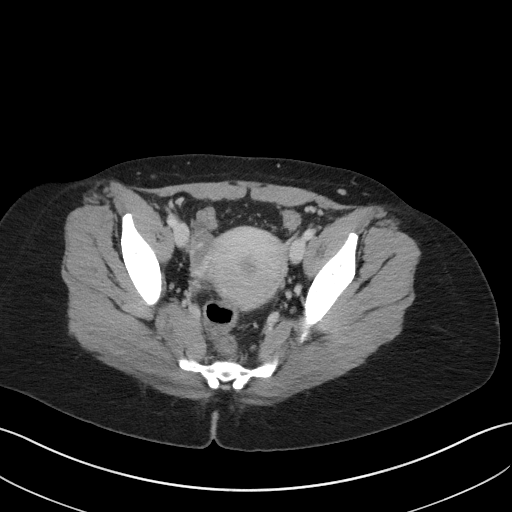
[im 31/93  soft-tissue]
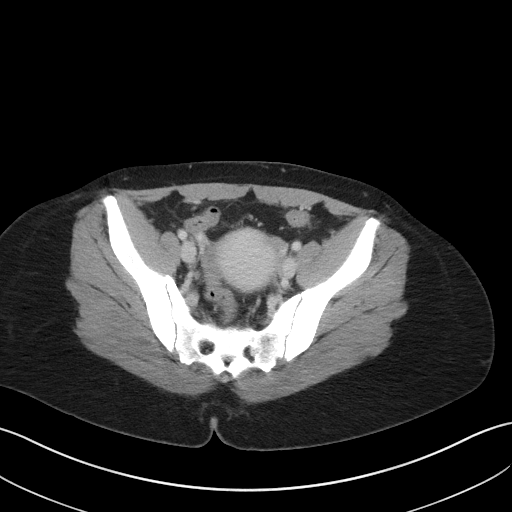
[im 39/93  soft-tissue]
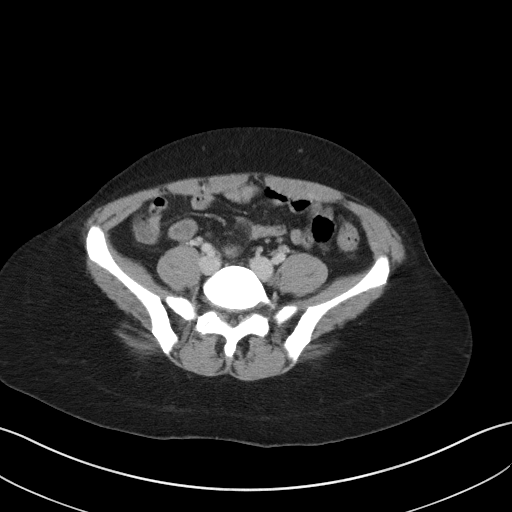
[im 47/93  soft-tissue]
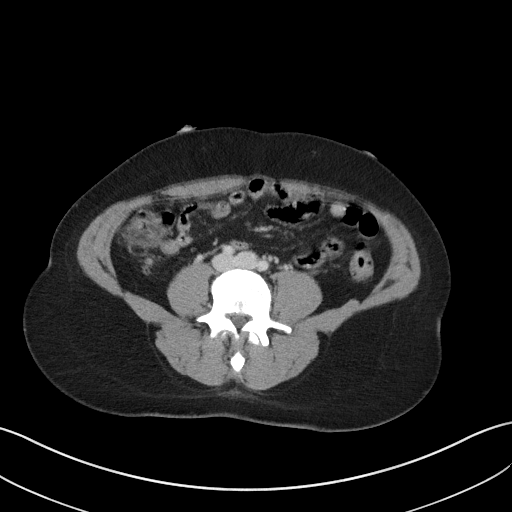
[im 54/93  soft-tissue]
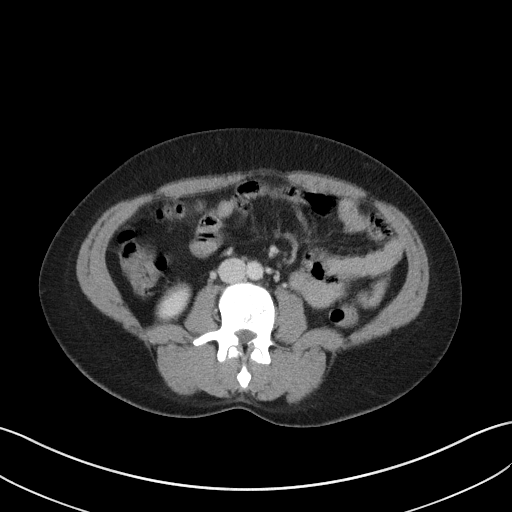
[im 62/93  soft-tissue]
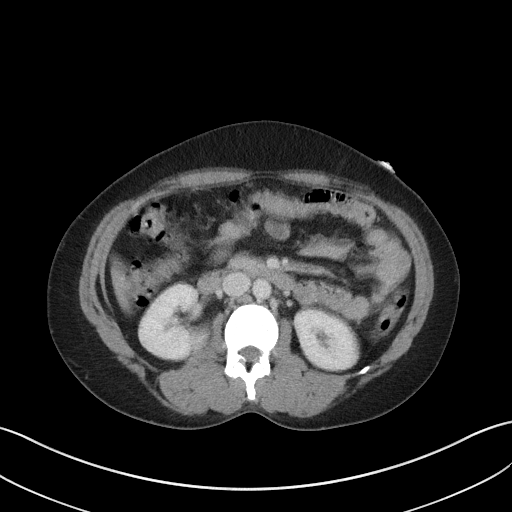
[im 62/93  bone]
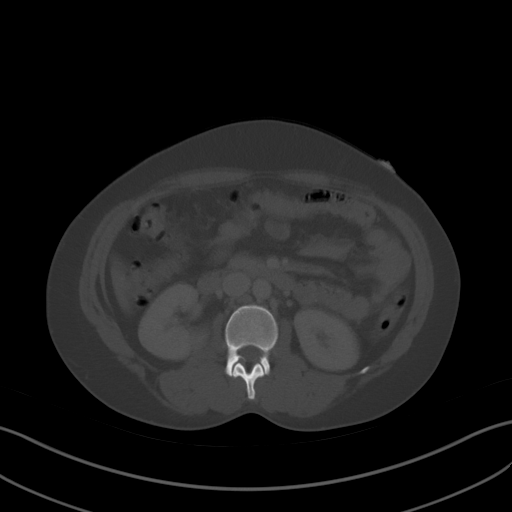
[im 66/93  soft-tissue]
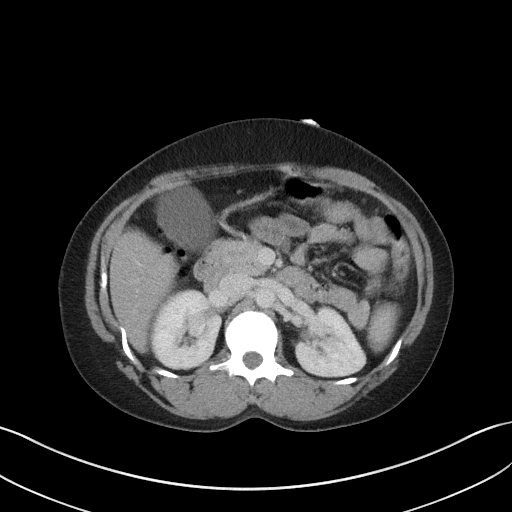
[im 73/93  soft-tissue]
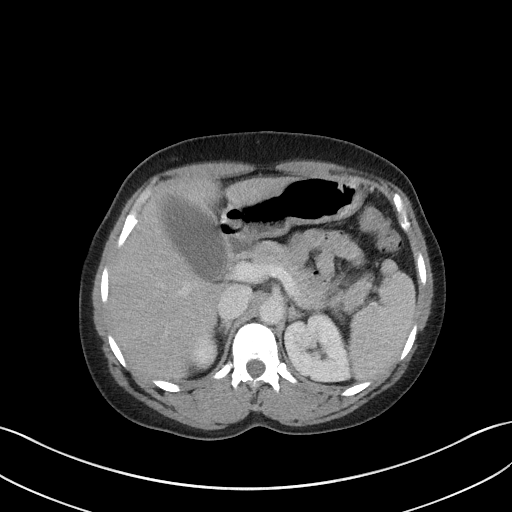
[im 81/93  soft-tissue]
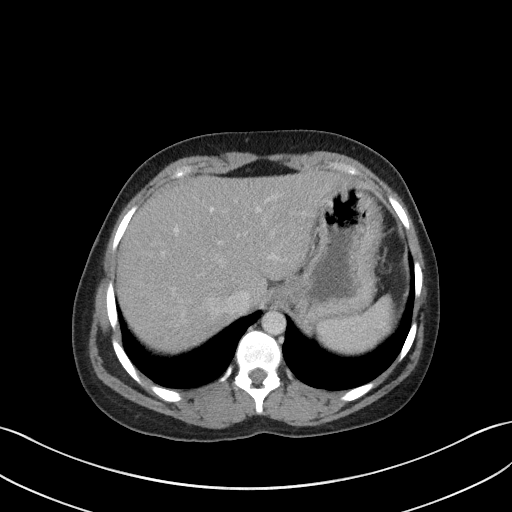
[im 89/93  soft-tissue]
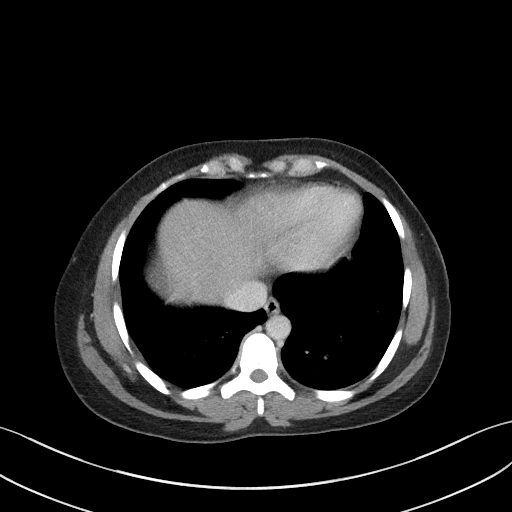

[Series 5: coronal st · coronal · 0.70mm/px · 3 of 94 slices shown]
[im 32/94  soft-tissue]
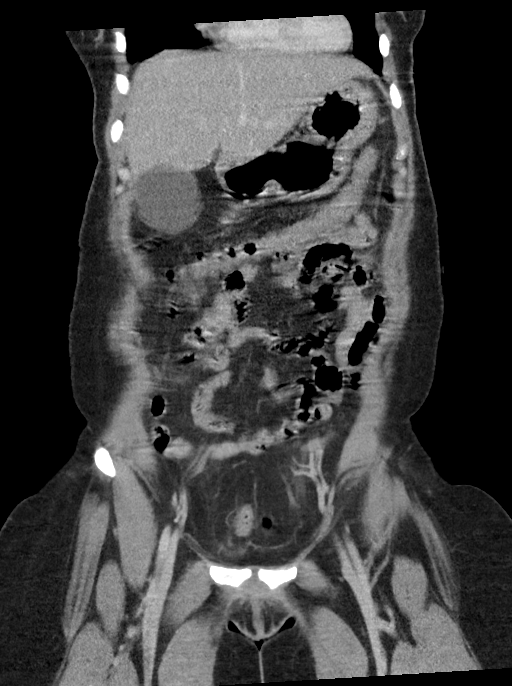
[im 42/94  soft-tissue]
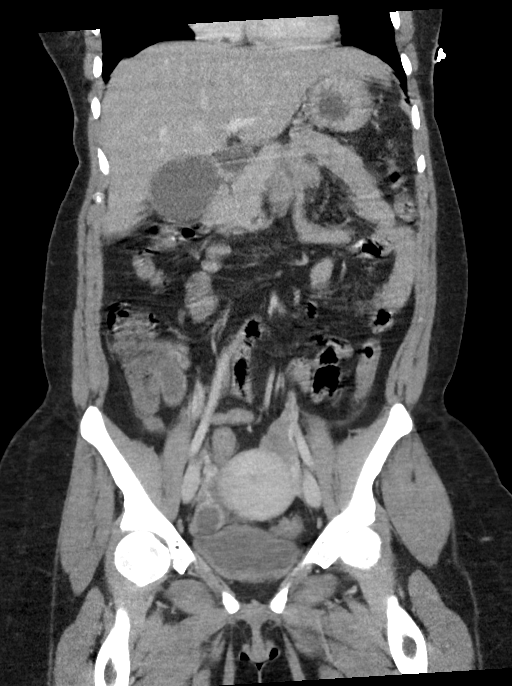
[im 52/94  soft-tissue]
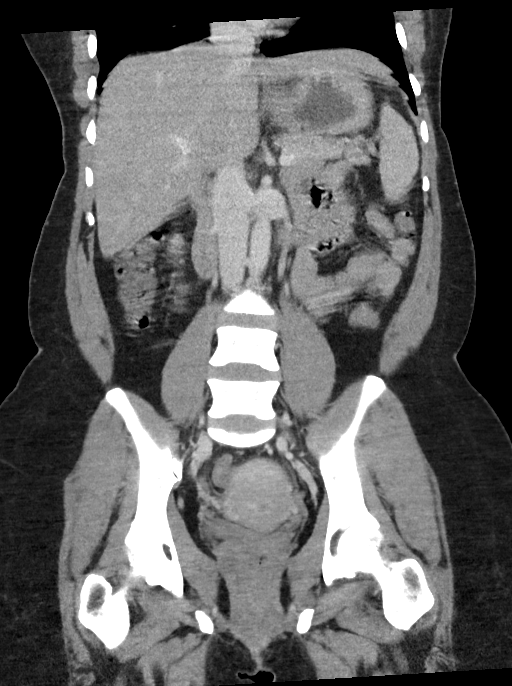

[16 of 46 positions shown; findings below may reference images not displayed]

FINDINGS: Lower chest: Lung bases are clear.

Hepatobiliary: No focal hepatic lesion. No biliary duct dilatation.
Common bile duct is normal. Gallbladder is distended to 4.2 cm but
within normal limits. No evidence of inflammation.

Pancreas: Pancreas is normal. No ductal dilatation. No pancreatic
inflammation.

Spleen: Normal spleen

Adrenals/urinary tract: Adrenal glands and kidneys are normal. The
ureters and bladder normal.

Stomach/Bowel: Stomach, small bowel, appendix, and cecum are normal.
The colon and rectosigmoid colon are normal.

Vascular/Lymphatic: Abdominal aorta is normal caliber. No periportal
or retroperitoneal adenopathy. No pelvic adenopathy.

Reproductive: Uterus and adnexa unremarkable. Enhancing follicle of
the RIGHT ovary consistent with corpus luteal cyst.

Other: No free fluid.

Musculoskeletal: No aggressive osseous lesion.
IMPRESSION: 1. No acute findings in the abdomen pelvis.
2. Normal appendix.
3. Gallbladder distended but within normal limits in diameter.
